# Patient Record
Sex: Male | Born: 1990 | Race: White | Hispanic: No | Marital: Married | Smoking: Former smoker
Health system: Southern US, Community
[De-identification: ages and names within clinical notes are randomized; demographics above are authoritative.]

## PROBLEM LIST (undated history)

## (undated) DIAGNOSIS — K760 Fatty (change of) liver, not elsewhere classified: Secondary | ICD-10-CM

## (undated) HISTORY — PX: WRIST SURGERY: SHX841

## (undated) HISTORY — PX: WRIST FRACTURE SURGERY: SHX121

## (undated) HISTORY — DX: Fatty (change of) liver, not elsewhere classified: K76.0

---
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
## 2012-09-08 ENCOUNTER — Emergency Department (HOSPITAL_COMMUNITY)
Admission: EM | Admit: 2012-09-08 | Discharge: 2012-09-08 | Disposition: A | Discharge status: Home / Self Care | Payer: Worker's Compensation | Attending: Emergency Medicine | Admitting: Emergency Medicine

## 2012-09-08 ENCOUNTER — Encounter (HOSPITAL_COMMUNITY): Payer: Self-pay

## 2012-09-08 DIAGNOSIS — L0390 Cellulitis, unspecified: Secondary | ICD-10-CM

## 2012-09-08 DIAGNOSIS — L03319 Cellulitis of trunk, unspecified: Secondary | ICD-10-CM | POA: Insufficient documentation

## 2012-09-08 DIAGNOSIS — L02219 Cutaneous abscess of trunk, unspecified: Secondary | ICD-10-CM | POA: Insufficient documentation

## 2012-09-08 LAB — CBC WITH DIFFERENTIAL/PLATELET
Basophils Absolute: 0 10*3/uL (ref 0.0–0.1)
Eosinophils Relative: 1 % (ref 0–5)
Lymphocytes Relative: 18 % (ref 12–46)
Lymphs Abs: 1.9 10*3/uL (ref 0.7–4.0)
MCV: 85.7 fL (ref 78.0–100.0)
Neutro Abs: 7.5 10*3/uL (ref 1.7–7.7)
Neutrophils Relative %: 71 % (ref 43–77)
Platelets: 214 10*3/uL (ref 150–400)
RBC: 4.88 MIL/uL (ref 4.22–5.81)
RDW: 12.5 % (ref 11.5–15.5)
WBC: 10.6 10*3/uL — ABNORMAL HIGH (ref 4.0–10.5)

## 2012-09-08 LAB — BASIC METABOLIC PANEL
CO2: 30 mEq/L (ref 19–32)
Calcium: 9.9 mg/dL (ref 8.4–10.5)
Glucose, Bld: 87 mg/dL (ref 70–99)
Potassium: 3.6 mEq/L (ref 3.5–5.1)
Sodium: 137 mEq/L (ref 135–145)

## 2012-09-08 LAB — URINALYSIS, ROUTINE W REFLEX MICROSCOPIC
Glucose, UA: NEGATIVE mg/dL
Leukocytes, UA: NEGATIVE
Protein, ur: 30 mg/dL — AB
Specific Gravity, Urine: 1.02 (ref 1.005–1.030)
Urobilinogen, UA: 0.2 mg/dL (ref 0.0–1.0)

## 2012-09-08 LAB — URINE MICROSCOPIC-ADD ON

## 2012-09-08 MED ORDER — VANCOMYCIN HCL IN DEXTROSE 1-5 GM/200ML-% IV SOLN
1000.0000 mg | Freq: Once | INTRAVENOUS | Status: AC
  Administered 2012-09-08: 1000 mg via INTRAVENOUS
  Filled 2012-09-08: qty 200

## 2012-09-08 MED ORDER — ONDANSETRON HCL 4 MG/2ML IJ SOLN
4.0000 mg | Freq: Once | INTRAMUSCULAR | Status: AC
Start: 2012-09-08 — End: 2012-09-08
  Administered 2012-09-08: 4 mg via INTRAVENOUS
  Filled 2012-09-08: qty 2

## 2012-09-08 MED ORDER — HYDROMORPHONE HCL PF 1 MG/ML IJ SOLN
1.0000 mg | Freq: Once | INTRAMUSCULAR | Status: AC
  Administered 2012-09-08: 1 mg via INTRAVENOUS
  Filled 2012-09-08: qty 1

## 2012-09-08 MED ORDER — SULFAMETHOXAZOLE-TRIMETHOPRIM 800-160 MG PO TABS: 1.0000 | ORAL_TABLET | Freq: Two times a day (BID) | ORAL | Status: DC

## 2012-09-08 NOTE — ED Notes (Signed)
Pt reports was unloading a truck at work and reports had a sharp pain in left groin.  PT says continued to work and when he got home he noticed swelling left groin.

## 2012-09-08 NOTE — ED Notes (Signed)
Incident happened at [facility] in [place] - number [phone],  Per Helene Shoe, Unit Director, drug screen is required.

## 2012-09-08 NOTE — ED Provider Notes (Signed)
History   Scribed for Donnetta Hutching, MD, the patient was seen in room APA01/APA01 . This chart was scribed by Lewanda Rife.    CSN: [id number]  Arrival date & time 09/08/12  1510   First MD Initiated Contact with Patient 09/08/12 1535      Chief Complaint  Patient presents with  . Groin Pain    (Consider location/radiation/quality/duration/timing/severity/associated sxs/prior treatment) HPI Hx was provided by the pt.  Jeffrey Glass is a 20 y.o. male who presents to the Emergency Department complaining of constant mild left groin pain since earlier today. Pt states he was lifting heavy boxes at work and felt a sharp left groin pain. Pt states he noticed mild swelling on left groin when he got home.   History reviewed. No pertinent past medical history.  Past Surgical History  Procedure Date  . Wrist surgery     No family history on file.  History  Substance Use Topics  . Smoking status: Never Smoker   . Smokeless tobacco: Not on file  . Alcohol Use: No      Review of Systems  Constitutional: Negative.   HENT: Negative.   Respiratory: Negative.   Cardiovascular: Negative.   Gastrointestinal: Negative.   Genitourinary:       Groin pain   Musculoskeletal: Negative.   Skin: Negative.   Neurological: Negative.   Hematological: Negative.   Psychiatric/Behavioral: Negative.   All other systems reviewed and are negative.    Allergies  Cephalexin  Home Medications  No current outpatient prescriptions on file.  BP 146/80  Pulse 88  Temp 98.8 F (37.1 C) (Oral)  Resp 20  Ht 6\' 1"  (1.854 m)  Wt 250 lb (113.399 kg)  BMI 32.98 kg/m2  SpO2 100%  Physical Exam  Nursing note and vitals reviewed. Constitutional: He is oriented to person, place, and time. He appears well-developed and well-nourished.  HENT:  Head: Normocephalic and atraumatic.  Eyes: Conjunctivae normal and EOM are normal. Pupils are equal, round, and reactive to light.  Neck: Normal range  of motion. Neck supple.  Cardiovascular: Normal rate, regular rhythm and normal heart sounds.   Pulmonary/Chest: Effort normal and breath sounds normal.  Abdominal: Soft. Bowel sounds are normal. Hernia confirmed negative in the right inguinal area and confirmed negative in the left inguinal area.  Genitourinary: Testes normal and penis normal.     Musculoskeletal: Normal range of motion.  Lymphadenopathy:       Right: No inguinal adenopathy present.       Left: No inguinal adenopathy present.  Neurological: He is alert and oriented to person, place, and time.  Skin: Skin is warm and dry.  Psychiatric: He has a normal mood and affect.    ED Course  Procedures (including critical care time) 4:09pm Pt informed of plan to be given antibiotics in the ED and d/c post txt. Labs Reviewed - No data to display No results found.   No diagnosis found. Results for orders placed during the hospital encounter of 09/08/12  CBC WITH DIFFERENTIAL      Component Value Range   WBC 10.6 (*) 4.0 - 10.5 K/uL   RBC 4.88  4.22 - 5.81 MIL/uL   Hemoglobin 15.2  13.0 - 17.0 g/dL   HCT 13.0  86.5 - 78.4 %   MCV 85.7  78.0 - 100.0 fL   MCH 31.1  26.0 - 34.0 pg   MCHC 36.4 (*) 30.0 - 36.0 g/dL   RDW 69.6  29.5 -  15.5 %   Platelets 214  150 - 400 K/uL   Neutrophils Relative 71  43 - 77 %   Neutro Abs 7.5  1.7 - 7.7 K/uL   Lymphocytes Relative 18  12 - 46 %   Lymphs Abs 1.9  0.7 - 4.0 K/uL   Monocytes Relative 10  3 - 12 %   Monocytes Absolute 1.0  0.1 - 1.0 K/uL   Eosinophils Relative 1  0 - 5 %   Eosinophils Absolute 0.1  0.0 - 0.7 K/uL   Basophils Relative 0  0 - 1 %   Basophils Absolute 0.0  0.0 - 0.1 K/uL  BASIC METABOLIC PANEL      Component Value Range   Sodium 137  135 - 145 mEq/L   Potassium 3.6  3.5 - 5.1 mEq/L   Chloride 97  96 - 112 mEq/L   CO2 30  19 - 32 mEq/L   Glucose, Bld 87  70 - 99 mg/dL   BUN 14  6 - 23 mg/dL   Creatinine, Ser 4.09  0.50 - 1.35 mg/dL   Calcium 9.9  8.4 -  81.1 mg/dL   GFR calc non Af Amer >90  >90 mL/min   GFR calc Af Amer >90  >90 mL/min  URINALYSIS, ROUTINE W REFLEX MICROSCOPIC      Component Value Range   Color, Urine YELLOW  YELLOW   APPearance CLEAR  CLEAR   Specific Gravity, Urine 1.020  1.005 - 1.030   pH 6.0  5.0 - 8.0   Glucose, UA NEGATIVE  NEGATIVE mg/dL   Hgb urine dipstick NEGATIVE  NEGATIVE   Bilirubin Urine NEGATIVE  NEGATIVE   Ketones, ur NEGATIVE  NEGATIVE mg/dL   Protein, ur 30 (*) NEGATIVE mg/dL   Urobilinogen, UA 0.2  0.0 - 1.0 mg/dL   Nitrite NEGATIVE  NEGATIVE   Leukocytes, UA NEGATIVE  NEGATIVE  URINE MICROSCOPIC-ADD ON      Component Value Range   Squamous Epithelial / LPF RARE  RARE   Bacteria, UA RARE  RARE     MDM  History and physical consistent with cellulitis.  No I and D necessary. Rx IV vancomycin. Discharge home with Septra DS for 10 days.      I personally performed the services described in this documentation, which was scribed in my presence. The recorded information has been reviewed and considered.   Donnetta Hutching, MD 09/08/12 1754

## 2012-09-08 NOTE — ED Notes (Signed)
After speaking with edp and hearing dx, pt decided not to pursue workers comp claim.

## 2014-07-10 ENCOUNTER — Emergency Department (HOSPITAL_COMMUNITY)
Admission: EM | Admit: 2014-07-10 | Discharge: 2014-07-10 | Disposition: A | Discharge status: Home / Self Care | Payer: Worker's Compensation | Attending: Emergency Medicine | Admitting: Emergency Medicine

## 2014-07-10 ENCOUNTER — Encounter (HOSPITAL_COMMUNITY): Payer: Self-pay | Admitting: Emergency Medicine

## 2014-07-10 ENCOUNTER — Emergency Department (HOSPITAL_COMMUNITY): Payer: Worker's Compensation

## 2014-07-10 DIAGNOSIS — R109 Unspecified abdominal pain: Secondary | ICD-10-CM | POA: Diagnosis not present

## 2014-07-10 DIAGNOSIS — N509 Disorder of male genital organs, unspecified: Secondary | ICD-10-CM | POA: Diagnosis present

## 2014-07-10 DIAGNOSIS — N508 Other specified disorders of male genital organs: Secondary | ICD-10-CM | POA: Insufficient documentation

## 2014-07-10 DIAGNOSIS — Z87891 Personal history of nicotine dependence: Secondary | ICD-10-CM | POA: Insufficient documentation

## 2014-07-10 DIAGNOSIS — N50812 Left testicular pain: Secondary | ICD-10-CM

## 2014-07-10 LAB — URINALYSIS, ROUTINE W REFLEX MICROSCOPIC
Bilirubin Urine: NEGATIVE
GLUCOSE, UA: NEGATIVE mg/dL
Hgb urine dipstick: NEGATIVE
KETONES UR: NEGATIVE mg/dL
LEUKOCYTES UA: NEGATIVE
Nitrite: NEGATIVE
PH: 6 (ref 5.0–8.0)
Protein, ur: 100 mg/dL — AB
Urobilinogen, UA: 0.2 mg/dL (ref 0.0–1.0)

## 2014-07-10 LAB — URINE MICROSCOPIC-ADD ON

## 2014-07-10 NOTE — ED Notes (Signed)
Sharp pain in L groin, just to L of testicle began this morning at work.  As he began to work faster, lifting heavy boxes, pain became more severe.  When seated, pain isn't as bad as when he is trying to work.   Denies hx of hernias.

## 2014-07-10 NOTE — Discharge Instructions (Signed)
Scan showed no twisting in the testicle. Ice pack, jock strap, Tylenol or ibuprofen for pain. Avoid heavy lifting.

## 2014-07-10 NOTE — ED Provider Notes (Signed)
CSN: [id number]     Arrival date & time 07/10/14  1807 History  This chart was scribed for Jeffrey HutchingBrian Jewell Haught, MD by Modena JanskyAlbert Thayil, ED Scribe. This patient was seen in room APA10/APA10 and the patient's care was started at 9:10 PM.   Chief Complaint  Patient presents with  . Groin Pain   HPI HPI Comments: Jeffrey SkeeterBrandon L Glass is a 23 y.o. male who presents to the Emergency Department complaining of left groin pain that started about nine hours ago. He states that the pain started while he was at work today. He reports that the pain is in his left testicle. He describes the pain as sharp. He states that the pain had a sudden onset. He states that the pain is exacerbated with heavy lifting. He states that urinating temporarily relieves the pain. He reports no hx of hernias.   History reviewed. No pertinent past medical history. Past Surgical History  Procedure Laterality Date  . Wrist surgery    . Wrist fracture surgery Left    History reviewed. No pertinent family history. History  Substance Use Topics  . Smoking status: Former Games developermoker  . Smokeless tobacco: Not on file  . Alcohol Use: No    Review of Systems A complete 10 system review of systems was obtained and all systems are negative except as noted in the HPI and PMH.   Allergies  Cephalexin  Home Medications   Prior to Admission medications   Not on File   BP 127/76  Pulse 95  Temp(Src) 98.7 F (37.1 C) (Oral)  Resp 14  Ht 6\' 1"  (1.854 m)  Wt 275 lb (124.739 kg)  BMI 36.29 kg/m2  SpO2 100% Physical Exam  Nursing note and vitals reviewed. Constitutional: He is oriented to person, place, and time. He appears well-developed and well-nourished.  HENT:  Head: Normocephalic and atraumatic.  Eyes: Conjunctivae and EOM are normal. Pupils are equal, round, and reactive to light.  Neck: Normal range of motion. Neck supple.  Cardiovascular: Normal rate, regular rhythm and normal heart sounds.   Pulmonary/Chest: Effort normal and breath  sounds normal.  Abdominal: Soft. Bowel sounds are normal.  Genitourinary:  Left testicle and epididymis was tender.  Musculoskeletal: Normal range of motion.  Neurological: He is alert and oriented to person, place, and time.  Skin: Skin is warm and dry.  Psychiatric: He has a normal mood and affect. His behavior is normal.    ED Course  Procedures (including critical care time) DIAGNOSTIC STUDIES: Oxygen Saturation is 100% on RA, normal by my interpretation.    COORDINATION OF CARE: 9:14 PM- Pt advised of plan for treatment which includes an ultrasound and pt agrees.  Labs Review Labs Reviewed  URINALYSIS, ROUTINE W REFLEX MICROSCOPIC - Abnormal; Notable for the following:    Specific Gravity, Urine >1.030 (*)    Protein, ur 100 (*)    All other components within normal limits  URINE MICROSCOPIC-ADD ON    Imaging Review [place] Scrotum  07/10/2014   CLINICAL DATA:  Left testicular pain.  EXAM: SCROTAL ULTRASOUND  DOPPLER ULTRASOUND OF THE TESTICLES  TECHNIQUE: Complete ultrasound examination of the testicles, epididymis, and other scrotal structures was performed. Color and spectral Doppler ultrasound were also utilized to evaluate blood flow to the testicles.  COMPARISON:  None.  FINDINGS: Right testicle  Measurements: 4.5 x 2 x 2.5 cm. No mass or microlithiasis visualized.  Left testicle  Measurements: 4.7 x 1.8 x 2.4 cm. No mass or microlithiasis visualized.  Right epididymis:  Normal in size and appearance.  Left epididymis:  Normal in size and appearance.  Hydrocele:  Minimal left hydrocele.  Varicocele:  Large bilateral varicoceles, greater on the left.  Pulsed Doppler interrogation of both testes demonstrates low resistance arterial and venous waveforms bilaterally. With color flow Doppler images demonstrate flow to both testes and epididymides.  IMPRESSION: Normal ultrasound appearance to the testicles. No evidence of testicular mass or torsion. Bilateral scrotal varicoceles.    Electronically Signed   By: Burman Nieves M.D.   On: 07/10/2014 22:34   [place] Art/ven Flow Abd Pelv Doppler  07/10/2014   CLINICAL DATA:  Left testicular pain.  EXAM: SCROTAL ULTRASOUND  DOPPLER ULTRASOUND OF THE TESTICLES  TECHNIQUE: Complete ultrasound examination of the testicles, epididymis, and other scrotal structures was performed. Color and spectral Doppler ultrasound were also utilized to evaluate blood flow to the testicles.  COMPARISON:  None.  FINDINGS: Right testicle  Measurements: 4.5 x 2 x 2.5 cm. No mass or microlithiasis visualized.  Left testicle  Measurements: 4.7 x 1.8 x 2.4 cm. No mass or microlithiasis visualized.  Right epididymis:  Normal in size and appearance.  Left epididymis:  Normal in size and appearance.  Hydrocele:  Minimal left hydrocele.  Varicocele:  Large bilateral varicoceles, greater on the left.  Pulsed Doppler interrogation of both testes demonstrates low resistance arterial and venous waveforms bilaterally. With color flow Doppler images demonstrate flow to both testes and epididymides.  IMPRESSION: Normal ultrasound appearance to the testicles. No evidence of testicular mass or torsion. Bilateral scrotal varicoceles.   Electronically Signed   By: Burman Nieves M.D.   On: 07/10/2014 22:34     EKG Interpretation None      MDM   Final diagnoses:  Pain in left testicle   Ultrasound of the left testicle reveals no torsion. Ice, no lifting, Tylenol or ibuprofen  I personally performed the services described in this documentation, which was scribed in my presence. The recorded information has been reviewed and is accurate.     Jeffrey Hutching, MD 07/10/14 [phone]

## 2015-02-23 IMAGING — US US SCROTUM
1 series · 14 of 25 positions shown · non-contrast
Comparison: None.

CLINICAL DATA: Left testicular pain.

EXAM:
SCROTAL ULTRASOUND
DOPPLER ULTRASOUND OF THE TESTICLES
TECHNIQUE: Complete ultrasound examination of the testicles, epididymis, and
other scrotal structures was performed. Color and spectral Doppler
ultrasound were also utilized to evaluate blood flow to the
testicles.

[Series 1: us scrotum · 0.07mm/px · 14 of 57 slices shown]
[im 1/57]
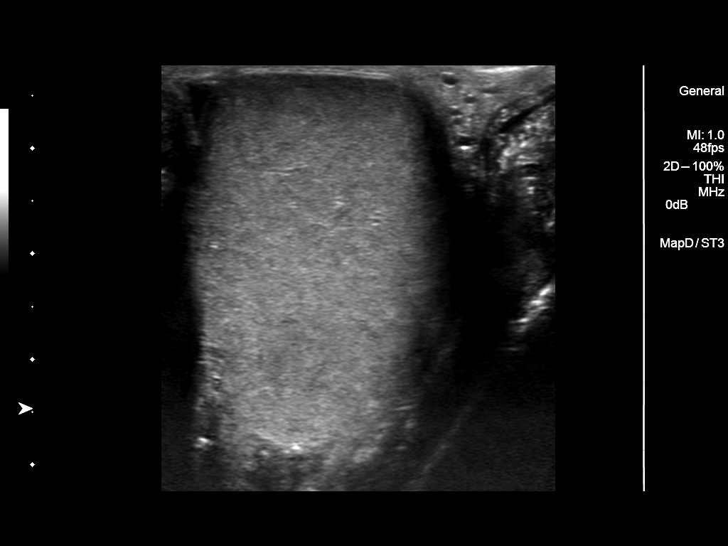
[im 5/57]
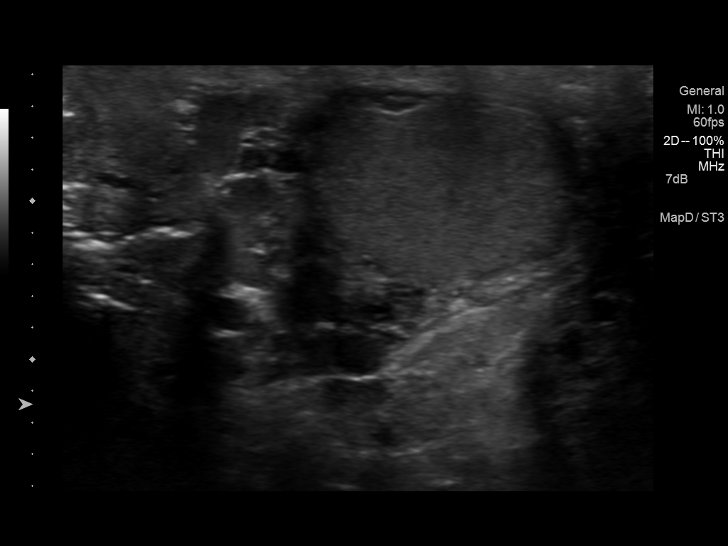
[im 10/57]
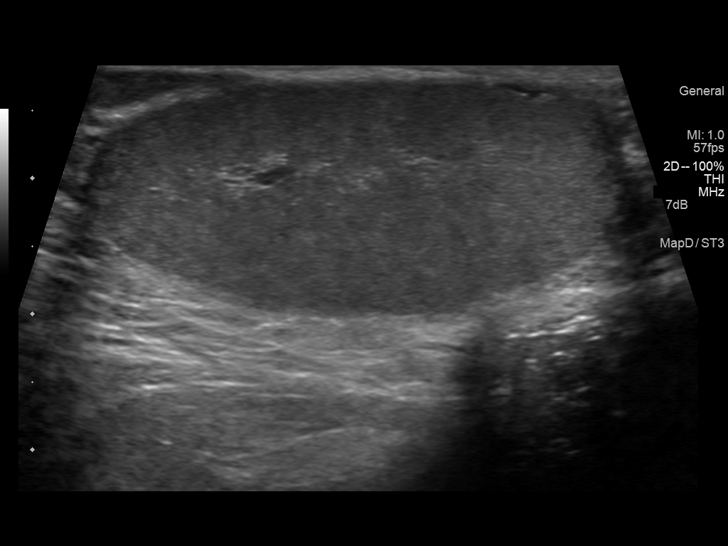
[im 15/57]
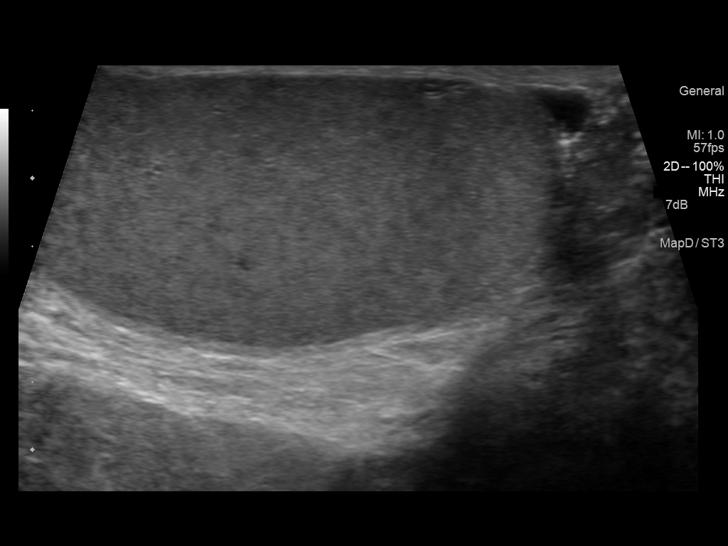
[im 19/57]
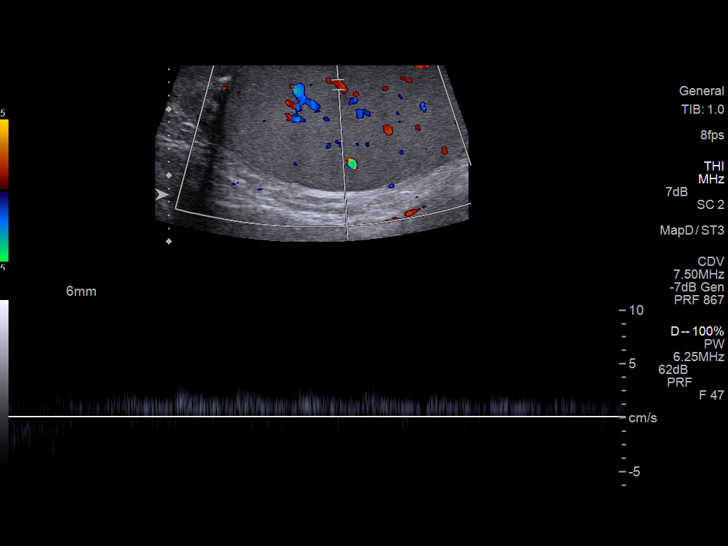
[im 22/57]
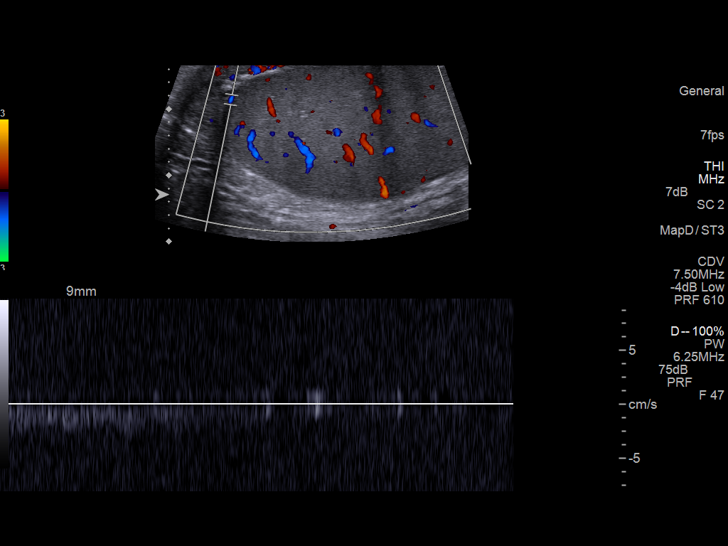
[im 26/57]
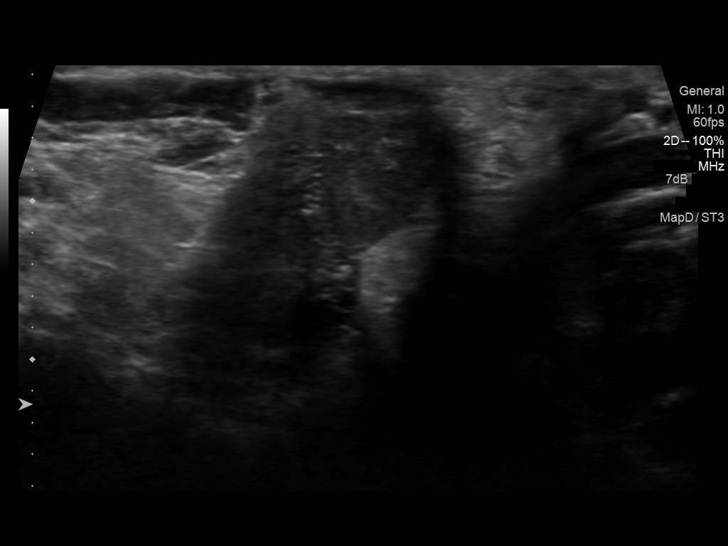
[im 31/57]
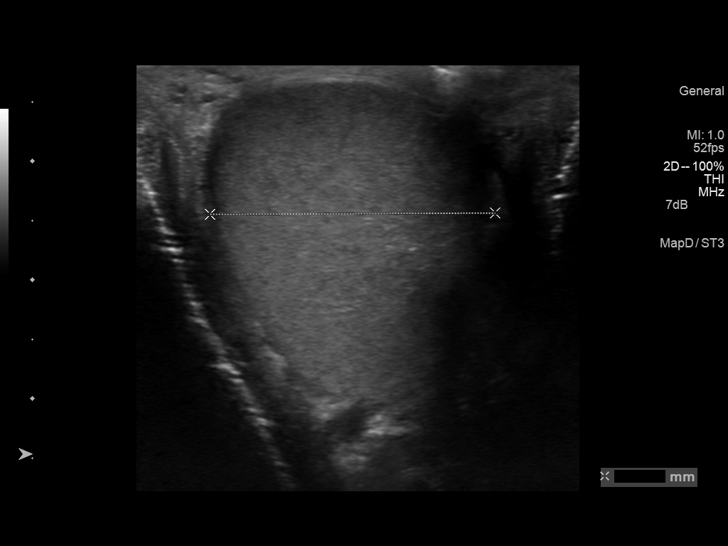
[im 36/57]
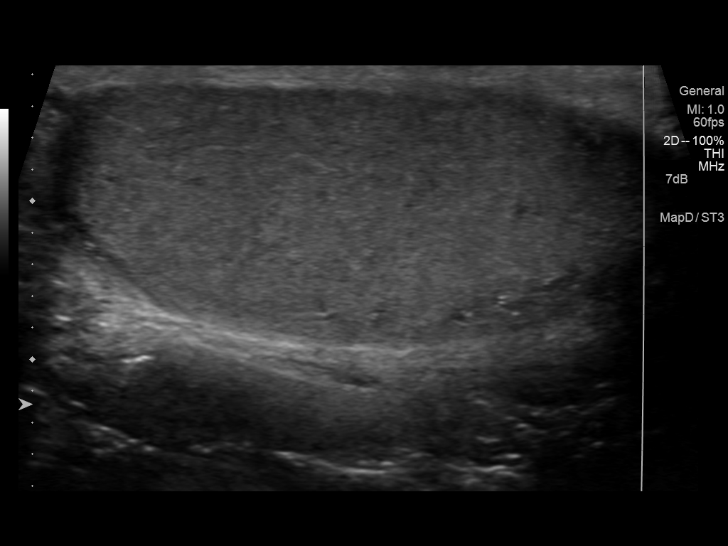
[im 38/57]
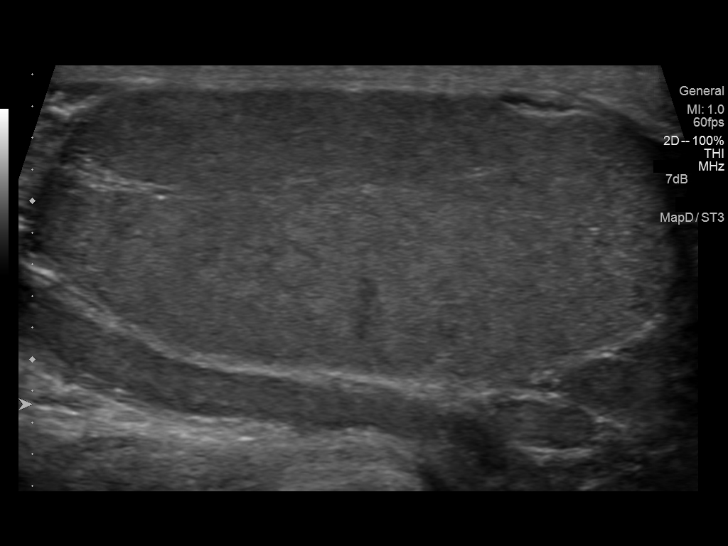
[im 43/57]
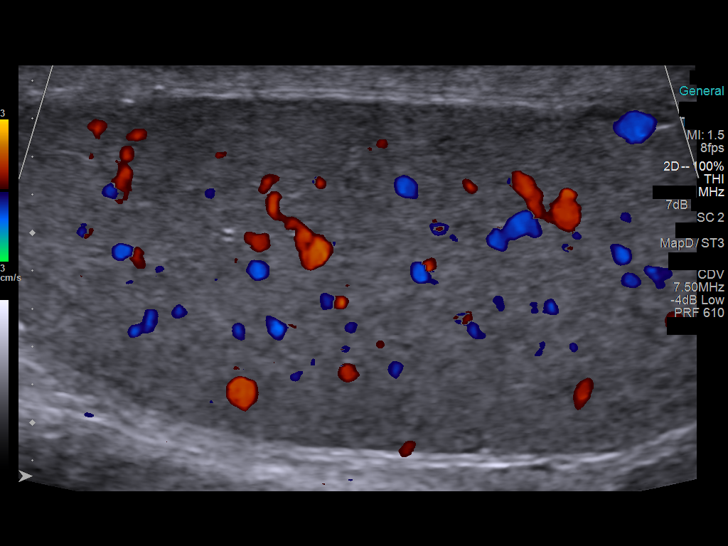
[im 47/57]
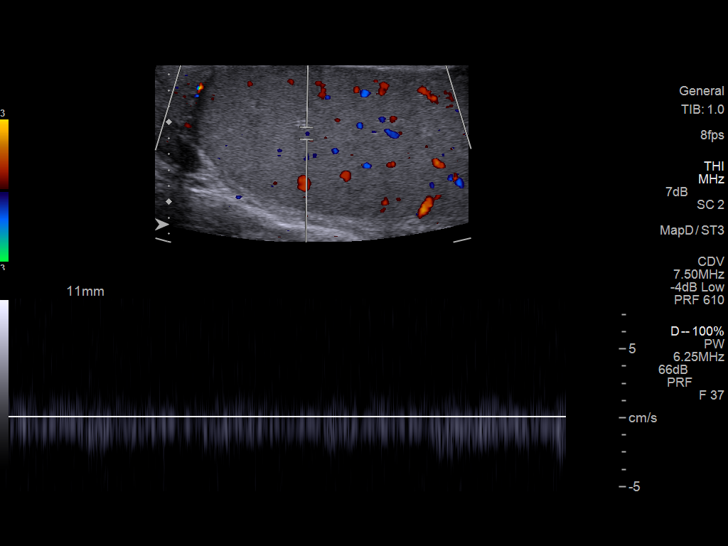
[im 52/57]
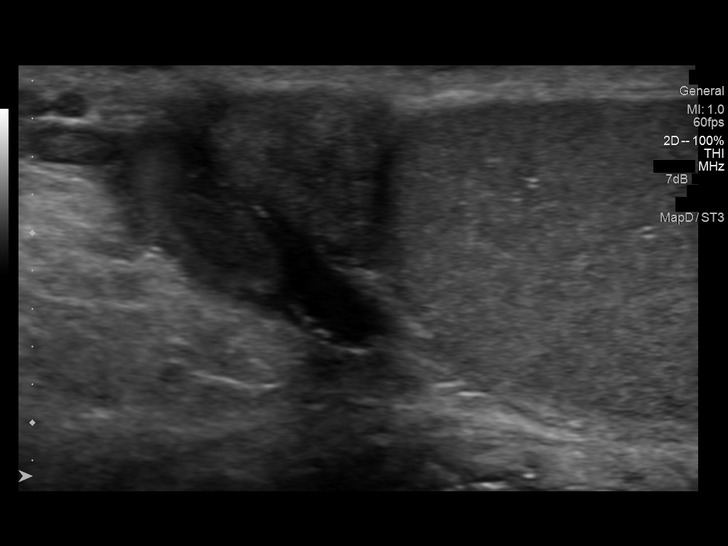
[im 57/57]
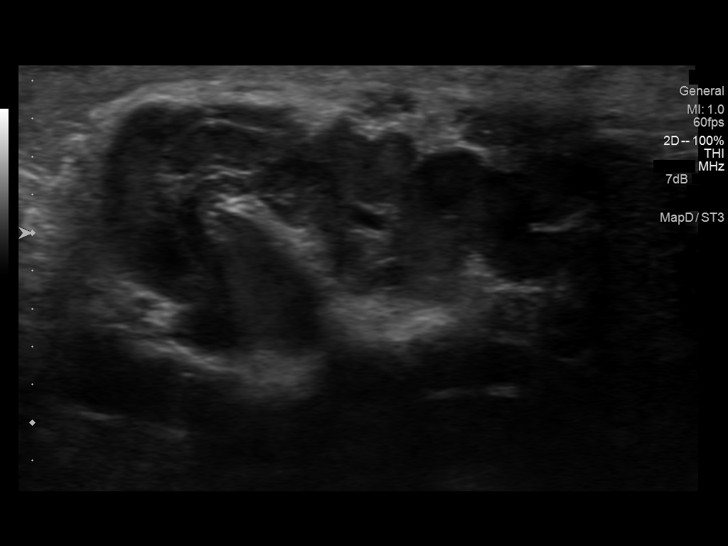

[14 of 25 positions shown; findings below may reference images not displayed]

FINDINGS: Right testicle

Measurements: 4.5 x 2 x 2.5 cm. No mass or microlithiasis
visualized.

Left testicle

Measurements: 4.7 x 1.8 x 2.4 cm. No mass or microlithiasis
visualized.

Right epididymis:  Normal in size and appearance.

Left epididymis:  Normal in size and appearance.

Hydrocele:  Minimal left hydrocele.

Varicocele:  Large bilateral varicoceles, greater on the left.

Pulsed Doppler interrogation of both testes demonstrates low
resistance arterial and venous waveforms bilaterally. With color
flow Doppler images demonstrate flow to both testes and
epididymides.
IMPRESSION: Normal ultrasound appearance to the testicles. No evidence of
testicular mass or torsion. Bilateral scrotal varicoceles.
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
## 2019-07-30 ENCOUNTER — Encounter (HOSPITAL_COMMUNITY): Payer: Self-pay | Admitting: Psychiatry

## 2019-07-30 ENCOUNTER — Ambulatory Visit (INDEPENDENT_AMBULATORY_CARE_PROVIDER_SITE_OTHER): Payer: 59 | Admitting: Psychiatry

## 2019-07-30 ENCOUNTER — Other Ambulatory Visit: Payer: Self-pay

## 2019-07-30 DIAGNOSIS — F321 Major depressive disorder, single episode, moderate: Secondary | ICD-10-CM

## 2019-07-30 NOTE — Patient Instructions (Addendum)
1. Continue citalopram 20 mg daily  2. Check TSH (thyroid test) by your primary care doctor.  3. Next appointment: 9/25 at 10 AM
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
## 2019-09-10 ENCOUNTER — Ambulatory Visit (HOSPITAL_COMMUNITY): Payer: 59 | Admitting: Psychiatry

## 2019-09-16 ENCOUNTER — Encounter (HOSPITAL_COMMUNITY): Payer: Self-pay | Admitting: Psychiatry

## 2019-09-16 ENCOUNTER — Ambulatory Visit (INDEPENDENT_AMBULATORY_CARE_PROVIDER_SITE_OTHER): Payer: 59 | Admitting: Psychiatry

## 2019-09-16 ENCOUNTER — Other Ambulatory Visit (HOSPITAL_COMMUNITY): Payer: Self-pay | Admitting: Psychiatry

## 2019-09-16 ENCOUNTER — Other Ambulatory Visit: Payer: Self-pay

## 2019-09-16 DIAGNOSIS — F320 Major depressive disorder, single episode, mild: Secondary | ICD-10-CM

## 2019-09-16 MED ORDER — CITALOPRAM HYDROBROMIDE 40 MG PO TABS: 40.0000 mg | ORAL_TABLET | Freq: Every day | ORAL | 1 refills | Status: DC

## 2019-09-16 NOTE — Patient Instructions (Signed)
1. Increase citalopram 40 mg daily  2. Check blood test (TSH) 3. Next appointment: 11/12 at 3:30

## 2019-10-28 ENCOUNTER — Ambulatory Visit (HOSPITAL_COMMUNITY): Payer: 59 | Admitting: Psychiatry

## 2019-11-02 ENCOUNTER — Telehealth (HOSPITAL_COMMUNITY): Payer: Self-pay | Admitting: Psychiatry

## 2019-11-02 ENCOUNTER — Other Ambulatory Visit: Payer: Self-pay

## 2019-11-02 ENCOUNTER — Ambulatory Visit (HOSPITAL_COMMUNITY): Payer: 59 | Admitting: Psychiatry

## 2019-11-02 NOTE — Telephone Encounter (Signed)
Sent link for video visit through [employer] [facility]. Patient did not sign in. Called the patient  twice for appointment scheduled today. The patient did not answer the phone. Voice message was full. 

## 2019-12-07 ENCOUNTER — Other Ambulatory Visit (HOSPITAL_COMMUNITY): Payer: Self-pay | Admitting: Psychiatry

## 2019-12-07 MED ORDER — CITALOPRAM HYDROBROMIDE 40 MG PO TABS: 40.0000 mg | ORAL_TABLET | Freq: Every day | ORAL | 1 refills | Status: DC

## 2019-12-20 ENCOUNTER — Ambulatory Visit (HOSPITAL_COMMUNITY): Payer: 59 | Admitting: Psychiatry

## 2020-01-04 ENCOUNTER — Encounter: Payer: Self-pay | Admitting: Psychiatry

## 2020-01-04 ENCOUNTER — Other Ambulatory Visit: Payer: Self-pay

## 2020-01-04 ENCOUNTER — Ambulatory Visit (INDEPENDENT_AMBULATORY_CARE_PROVIDER_SITE_OTHER): Payer: 59 | Admitting: Psychiatry

## 2020-01-04 DIAGNOSIS — F325 Major depressive disorder, single episode, in full remission: Secondary | ICD-10-CM | POA: Insufficient documentation

## 2020-01-04 MED ORDER — CITALOPRAM HYDROBROMIDE 40 MG PO TABS: 40.0000 mg | ORAL_TABLET | Freq: Every day | ORAL | 0 refills | Status: AC

## 2020-01-04 MED ORDER — TRAZODONE HCL 50 MG PO TABS: 50.0000 mg | ORAL_TABLET | Freq: Every evening | ORAL | 0 refills | Status: AC | PRN

## 2020-01-04 NOTE — Progress Notes (Signed)
BH MD OP Progress Note  I connected with  Jeffrey Glass on 01/04/20 by a video enabled telemedicine application and verified that I am speaking with the correct person using two identifiers.   I discussed the limitations of evaluation and management by telemedicine. The patient expressed understanding and agreed to proceed.    01/04/2020 2:55 PM Jeffrey Glass  MRN:  [id number]  Chief Complaint: " I am doing better."  HPI: Patient reported that he is found Celexa to be very helpful.  He stated his mood has been stable.  He is not as irritable like he was.  He stated that now he takes time to process things for reacting impulsively.  He feels he is on the right dose. He informed that his sleep is not as good as it used to be.  He has been taking Celexa at nighttime as he forgets his medication in the morning.  He would like to continue taking Celexa in the evening.  He was agreeable to have a as needed dose of trazodone for sleep.  He also informed a young baby at home which disrupts his sleep as well.  Visit Diagnosis:    ICD-10-CM   1. MDD (major depressive disorder), single episode, in full remission (HCC)  F32.5 citalopram (CELEXA) 40 MG tablet    traZODone (DESYREL) 50 MG tablet    Past Psychiatric History: depression  Past Medical History:  Past Medical History:  Diagnosis Date  . Fatty liver     Past Surgical History:  Procedure Laterality Date  . WRIST FRACTURE SURGERY Left   . WRIST SURGERY      Family Psychiatric History: denied  Family History: No family history on file.  Social History:  Social History   Socioeconomic History  . Marital status: Married    Spouse name: Not on file  . Number of children: Not on file  . Years of education: Not on file  . Highest education level: Not on file  Occupational History  . Not on file  Tobacco Use  . Smoking status: Former Smoker  Substance and Sexual Activity  . Alcohol use: No  . Drug use: No  . Sexual  activity: Not on file  Other Topics Concern  . Not on file  Social History Narrative  . Not on file   Social Determinants of Health   Financial Resource Strain:   . Difficulty of Paying Living Expenses: Not on file  Food Insecurity:   . Worried About Programme researcher, broadcasting/film/video in the Last Year: Not on file  . Ran Out of Food in the Last Year: Not on file  Transportation Needs:   . Lack of Transportation (Medical): Not on file  . Lack of Transportation (Non-Medical): Not on file  Physical Activity:   . Days of Exercise per Week: Not on file  . Minutes of Exercise per Session: Not on file  Stress:   . Feeling of Stress : Not on file  Social Connections:   . Frequency of Communication with Friends and Family: Not on file  . Frequency of Social Gatherings with Friends and Family: Not on file  . Attends Religious Services: Not on file  . Active Member of Clubs or Organizations: Not on file  . Attends Banker Meetings: Not on file  . Marital Status: Not on file    Allergies:  Allergies  Allergen Reactions  . Cephalexin Hives    Metabolic Disorder Labs: No results found for: HGBA1C,  MPG No results found for: PROLACTIN No results found for: CHOL, TRIG, HDL, CHOLHDL, VLDL, LDLCALC No results found for: TSH  Therapeutic Level Labs: No results found for: LITHIUM No results found for: VALPROATE No components found for:  CBMZ  Current Medications: Current Outpatient Medications  Medication Sig Dispense Refill  . citalopram (CELEXA) 40 MG tablet Take 1 tablet (40 mg total) by mouth daily. 90 tablet 0  . traZODone (DESYREL) 50 MG tablet Take 1 tablet (50 mg total) by mouth at bedtime as needed for sleep. 90 tablet 0   No current facility-administered medications for this visit.     Psychiatric Specialty Exam: Review of Systems  There were no vitals taken for this visit.There is no height or weight on file to calculate BMI.  General Appearance: Fairly Groomed  Eye  Contact:  Good  Speech:  Clear and Coherent and Normal Rate  Volume:  Normal  Mood:  Euthymic  Affect:  Congruent  Thought Process:  Goal Directed, Linear and Descriptions of Associations: Intact  Orientation:  Full (Time, Place, and Person)  Thought Content: Logical   Suicidal Thoughts:  No  Homicidal Thoughts:  No  Memory:  Recent;   Good Remote;   Good  Judgement:  Good  Insight:  Good  Psychomotor Activity:  Normal  Concentration:  Concentration: Good and Attention Span: Good  Recall:  Good  Fund of Knowledge: Good  Language: Good  Akathisia:  Negative  Handed:  Right  AIMS (if indicated): not done  Assets:  Communication Skills Desire for Improvement Financial Resources/Insurance Housing Social Support  ADL's:  Intact  Cognition: WNL  Sleep:  Fair      Assessment and Plan: Patient reported his mood has been stable with Celexa.  He requested for the medicine to help him sleep as needed.  He was offered trazodone. Potential side effects of medication and risks vs benefits of treatment vs non-treatment were explained and discussed. All questions were answered.  1. MDD (major depressive disorder), single episode, in full remission ([place])  - citalopram (CELEXA) 40 MG tablet; Take 1 tablet (40 mg total) by mouth daily.  Dispense: 90 tablet; Refill: 0 - traZODone (DESYREL) 50 MG tablet; Take 1 tablet (50 mg total) by mouth at bedtime as needed for sleep.  Dispense: 90 tablet; Refill: 0  F/up in 3 months.   Nevada Crane, MD 01/04/2020, 2:55 PM
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
## 2020-04-03 ENCOUNTER — Telehealth (HOSPITAL_COMMUNITY): Payer: Self-pay | Admitting: Psychiatry

## 2020-04-03 ENCOUNTER — Other Ambulatory Visit: Payer: Self-pay

## 2020-04-03 ENCOUNTER — Ambulatory Visit (HOSPITAL_COMMUNITY): Payer: 59 | Admitting: Psychiatry

## 2020-04-03 NOTE — Telephone Encounter (Signed)
Contacted him for the appointment today. He states that he never made this appointment and would like to cancel this today. He will call back for reschedule.
